# Patient Record
Sex: Female | Born: 1960 | Race: White | Hispanic: No | Smoking: Former smoker
Health system: Southern US, Community
[De-identification: ages and names within clinical notes are randomized; demographics above are authoritative.]

## PROBLEM LIST (undated history)

## (undated) HISTORY — PX: CHOLECYSTECTOMY: SHX55

---

## 2007-11-28 ENCOUNTER — Ambulatory Visit (HOSPITAL_COMMUNITY): Payer: Self-pay | Admitting: Psychiatry

## 2014-07-23 ENCOUNTER — Emergency Department (HOSPITAL_COMMUNITY): Payer: BLUE CROSS/BLUE SHIELD

## 2014-07-23 ENCOUNTER — Observation Stay (HOSPITAL_COMMUNITY)
Admission: EM | Admit: 2014-07-23 | Discharge: 2014-07-25 | Disposition: A | Discharge status: Home / Self Care | Payer: BLUE CROSS/BLUE SHIELD | Attending: Internal Medicine | Admitting: Internal Medicine

## 2014-07-23 ENCOUNTER — Encounter (HOSPITAL_COMMUNITY): Payer: Self-pay | Admitting: *Deleted

## 2014-07-23 DIAGNOSIS — I4581 Long QT syndrome: Principal | ICD-10-CM | POA: Insufficient documentation

## 2014-07-23 DIAGNOSIS — N179 Acute kidney failure, unspecified: Secondary | ICD-10-CM | POA: Insufficient documentation

## 2014-07-23 DIAGNOSIS — E669 Obesity, unspecified: Secondary | ICD-10-CM | POA: Diagnosis not present

## 2014-07-23 DIAGNOSIS — R55 Syncope and collapse: Secondary | ICD-10-CM | POA: Diagnosis present

## 2014-07-23 DIAGNOSIS — Z6834 Body mass index (BMI) 34.0-34.9, adult: Secondary | ICD-10-CM | POA: Diagnosis not present

## 2014-07-23 DIAGNOSIS — Z87891 Personal history of nicotine dependence: Secondary | ICD-10-CM | POA: Insufficient documentation

## 2014-07-23 DIAGNOSIS — R9431 Abnormal electrocardiogram [ECG] [EKG]: Secondary | ICD-10-CM

## 2014-07-23 LAB — COMPREHENSIVE METABOLIC PANEL
ALK PHOS: 148 U/L — AB (ref 38–126)
ALT: 23 U/L (ref 14–54)
AST: 21 U/L (ref 15–41)
Albumin: 3.7 g/dL (ref 3.5–5.0)
Anion gap: 5 (ref 5–15)
BUN: 10 mg/dL (ref 6–20)
CALCIUM: 8.8 mg/dL — AB (ref 8.9–10.3)
CO2: 27 mmol/L (ref 22–32)
Chloride: 108 mmol/L (ref 101–111)
Creatinine, Ser: 1.07 mg/dL — ABNORMAL HIGH (ref 0.44–1.00)
GFR calc Af Amer: >60 mL/min (ref >60)
GFR, EST NON AFRICAN AMERICAN: 58 mL/min — AB (ref >60)
GLUCOSE: 112 mg/dL — AB (ref 65–99)
POTASSIUM: 3.8 mmol/L (ref 3.5–5.1)
Sodium: 140 mmol/L (ref 135–145)
Total Bilirubin: 0.3 mg/dL (ref 0.3–1.2)
Total Protein: 6.8 g/dL (ref 6.5–8.1)

## 2014-07-23 LAB — URINALYSIS, ROUTINE W REFLEX MICROSCOPIC
BILIRUBIN URINE: NEGATIVE
GLUCOSE, UA: NEGATIVE mg/dL
Hgb urine dipstick: NEGATIVE
KETONES UR: NEGATIVE mg/dL
NITRITE: NEGATIVE
Protein, ur: NEGATIVE mg/dL
SPECIFIC GRAVITY, URINE: 1.022 (ref 1.005–1.030)
UROBILINOGEN UA: 1 mg/dL (ref 0.0–1.0)
pH: 6.5 (ref 5.0–8.0)

## 2014-07-23 LAB — CBC WITH DIFFERENTIAL/PLATELET
BASOS PCT: 1 % (ref 0–1)
Basophils Absolute: 0.1 10*3/uL (ref 0.0–0.1)
EOS ABS: 0.1 10*3/uL (ref 0.0–0.7)
EOS PCT: 1 % (ref 0–5)
HCT: 43.3 % (ref 36.0–46.0)
Hemoglobin: 14.5 g/dL (ref 12.0–15.0)
Lymphocytes Relative: 9 % — ABNORMAL LOW (ref 12–46)
Lymphs Abs: 1 10*3/uL (ref 0.7–4.0)
MCH: 27.6 pg (ref 26.0–34.0)
MCHC: 33.5 g/dL (ref 30.0–36.0)
MCV: 82.5 fL (ref 78.0–100.0)
Monocytes Absolute: 0.7 10*3/uL (ref 0.1–1.0)
Monocytes Relative: 6 % (ref 3–12)
NEUTROS ABS: 9.4 10*3/uL — AB (ref 1.7–7.7)
Neutrophils Relative %: 85 % — ABNORMAL HIGH (ref 43–77)
Platelets: 328 10*3/uL (ref 150–400)
RBC: 5.25 MIL/uL — ABNORMAL HIGH (ref 3.87–5.11)
RDW: 13.5 % (ref 11.5–15.5)
WBC: 11.1 10*3/uL — AB (ref 4.0–10.5)

## 2014-07-23 LAB — TROPONIN I: Troponin I: <0.03 ng/mL (ref <0.031)

## 2014-07-23 LAB — PREGNANCY, URINE: PREG TEST UR: NEGATIVE

## 2014-07-23 LAB — URINE MICROSCOPIC-ADD ON

## 2014-07-23 LAB — LIPASE, BLOOD: LIPASE: 17 U/L — AB (ref 22–51)

## 2014-07-23 MED ORDER — SODIUM CHLORIDE 0.9 % IV BOLUS (SEPSIS)
1000.0000 mL | Freq: Once | INTRAVENOUS | Status: AC
  Administered 2014-07-23: 1000 mL via INTRAVENOUS

## 2014-07-23 MED ORDER — ONDANSETRON HCL 4 MG/2ML IJ SOLN
4.0000 mg | Freq: Once | INTRAMUSCULAR | Status: AC
  Administered 2014-07-23: 4 mg via INTRAVENOUS
  Filled 2014-07-23: qty 2

## 2014-07-23 NOTE — ED Notes (Signed)
Pt states that she was at work eating lunch when she began having multiple episodes of diarrhea. Pt pt began feeling "tingly' in her hands and feet. Pt reports being diaphoretic. Pt had nausea and abdominal pain. Pt noted to be hypotensive upon EMS arrival. Pt BP increased during transport to 120 systolic. No IV fluids recieved

## 2014-07-23 NOTE — ED Notes (Signed)
Water provided

## 2014-07-23 NOTE — ED Notes (Signed)
Patient ambulated in the hallway and denied dizziness. Patient placed back on the monitor.

## 2014-07-23 NOTE — ED Notes (Signed)
Unsuccessful with the bedpan

## 2014-07-23 NOTE — H&P (Signed)
Cardiology History and Physical  History of Present Illness (and review of medical records): Sheila Dominguez is a 54 y.o. female who presents for evaluation of near syncope.  Symptoms began while at work today around [employer].  She went to eat lunch and then shortly after felt the need to use the bathroom.  She had 3 different blood movements which she says is normal since after her gall bladder removal.  However, she felt generalize malaise shortly thereafter.  She had to put her head down and stay seated as she felt lightheaded like she would pass out.  She did have a brief episode of abdominal discomfort that went away with BM.  She denied any shortness of breath, chest pain or palpitations.  She denied any syncope.  As she did not have improvement in symptoms, EMS was called.  Reportedly patient has hypotensive on initial vitals per EMS.  She was given Zofran and IVFs in ED.  Cardiology was consulted for near syncope in setting of prolonged QT on EKG.  She denies any prior hx of arrhythmia.  She takes ritalin for ADD.  Previous diagnostic testing for coronary artery disease includes: none. Previous history of cardiac disease includes None. oronary artery disease risk factors include: smoking/ tobacco exposure.  Patient denies history of angina, arrhythmia, cardiomyopathy, coronary artery disease, previous M.I. and valvular disease.  Review of Systems Further review of systems was otherwise negative other than stated in HPI.  Patient Active Problem List   Diagnosis Date Noted  . Prolonged Q-T interval on ECG 07/23/2014   History reviewed. No pertinent past medical history.  Past Surgical History  Procedure Laterality Date  . Cholecystectomy       (Not in a hospital admission) No Known Allergies  History  Substance Use Topics  . Smoking status: Former Research scientist (life sciences)  . Smokeless tobacco: Not on file  . Alcohol Use: No    No family history on file.   Objective:  Patient Vitals for the past 8  hrs:  BP Temp Temp src Pulse Resp SpO2  07/23/14 2215 149/74 mmHg - - 85 22 97 %  07/23/14 2145 134/68 mmHg - - 78 16 96 %  07/23/14 2115 125/87 mmHg - - 81 21 96 %  07/23/14 2100 135/79 mmHg - - 81 13 96 %  07/23/14 2030 143/84 mmHg - - 80 14 97 %  07/23/14 2015 133/81 mmHg - - 81 17 95 %  07/23/14 1956 131/99 mmHg - - 81 16 96 %  07/23/14 1815 128/77 mmHg - - 88 20 98 %  07/23/14 1730 109/68 mmHg - - 86 14 95 %  07/23/14 1715 110/70 mmHg - - 89 15 96 %  07/23/14 1710 128/71 mmHg 98 F (36.7 C) Oral 91 18 98 %  07/23/14 1705 128/71 mmHg 98.6 F (37 C) Oral 84 15 96 %   General appearance: alert, cooperative, appears stated age and no distress Head: Normocephalic, without obvious abnormality, atraumatic Eyes: conjunctivae/corneas clear. PERRL, EOM's intact. Neck: no carotid bruit, no JVD and supple, Lungs: clear to auscultation bilaterally Chest wall: no tenderness Heart: regular rate and rhythm, S1, S2 normal, no murmur, click, rub or gallop Abdomen: soft, non-tender; bowel sounds normal Extremities: extremities normal, atraumatic, no cyanosis or edema Pulses: 2+ and symmetric Neurologic: Grossly normal  Results for orders placed or performed during the hospital encounter of 07/23/14 (from the past 48 hour(s))  CBC with Differential/Platelet     Status: Abnormal   Collection Time: 07/23/14  7:54 PM  Result Value Ref Range   WBC 11.1 (H) 4.0 - 10.5 K/uL   RBC 5.25 (H) 3.87 - 5.11 MIL/uL   Hemoglobin 14.5 12.0 - 15.0 g/dL   HCT 43.3 36.0 - 46.0 %   MCV 82.5 78.0 - 100.0 fL   MCH 27.6 26.0 - 34.0 pg   MCHC 33.5 30.0 - 36.0 g/dL   RDW 13.5 11.5 - 15.5 %   Platelets 328 150 - 400 K/uL   Neutrophils Relative % 85 (H) 43 - 77 %   Neutro Abs 9.4 (H) 1.7 - 7.7 K/uL   Lymphocytes Relative 9 (L) 12 - 46 %   Lymphs Abs 1.0 0.7 - 4.0 K/uL   Monocytes Relative 6 3 - 12 %   Monocytes Absolute 0.7 0.1 - 1.0 K/uL   Eosinophils Relative 1 0 - 5 %   Eosinophils Absolute 0.1 0.0 - 0.7  K/uL   Basophils Relative 1 0 - 1 %   Basophils Absolute 0.1 0.0 - 0.1 K/uL  Comprehensive metabolic panel     Status: Abnormal   Collection Time: 07/23/14  7:54 PM  Result Value Ref Range   Sodium 140 135 - 145 mmol/L   Potassium 3.8 3.5 - 5.1 mmol/L   Chloride 108 101 - 111 mmol/L   CO2 27 22 - 32 mmol/L   Glucose, Bld 112 (H) 65 - 99 mg/dL   BUN 10 6 - 20 mg/dL   Creatinine, Ser 1.07 (H) 0.44 - 1.00 mg/dL   Calcium 8.8 (L) 8.9 - 10.3 mg/dL   Total Protein 6.8 6.5 - 8.1 g/dL   Albumin 3.7 3.5 - 5.0 g/dL   AST 21 15 - 41 U/L   ALT 23 14 - 54 U/L   Alkaline Phosphatase 148 (H) 38 - 126 U/L   Total Bilirubin 0.3 0.3 - 1.2 mg/dL   GFR calc non Af Amer 58 (L) >60 mL/min   GFR calc Af Amer >60 >60 mL/min    Comment: (NOTE) The eGFR has been calculated using the CKD EPI equation. This calculation has not been validated in all clinical situations. eGFR's persistently <60 mL/min signify possible Chronic Kidney Disease.    Anion gap 5 5 - 15  Lipase, blood     Status: Abnormal   Collection Time: 07/23/14  7:54 PM  Result Value Ref Range   Lipase 17 (L) 22 - 51 U/L  Troponin I     Status: None   Collection Time: 07/23/14  7:54 PM  Result Value Ref Range   Troponin I <0.03 <0.031 ng/mL    Comment:        NO INDICATION OF MYOCARDIAL INJURY.   Urinalysis, Routine w reflex microscopic (not at [facility])     Status: Abnormal   Collection Time: 07/23/14  8:09 PM  Result Value Ref Range   Color, Urine YELLOW YELLOW   APPearance CLEAR CLEAR   Specific Gravity, Urine 1.022 1.005 - 1.030   pH 6.5 5.0 - 8.0   Glucose, UA NEGATIVE NEGATIVE mg/dL   Hgb urine dipstick NEGATIVE NEGATIVE   Bilirubin Urine NEGATIVE NEGATIVE   Ketones, ur NEGATIVE NEGATIVE mg/dL   Protein, ur NEGATIVE NEGATIVE mg/dL   Urobilinogen, UA 1.0 0.0 - 1.0 mg/dL   Nitrite NEGATIVE NEGATIVE   Leukocytes, UA SMALL (A) NEGATIVE  Urine microscopic-add on     Status: Abnormal   Collection Time: 07/23/14  8:09 PM  Result  Value Ref Range   Squamous Epithelial / LPF  FEW (A) RARE   WBC, UA 3-6 <3 WBC/hpf   RBC / HPF 0-2 <3 RBC/hpf   Bacteria, UA FEW (A) RARE   Casts HYALINE CASTS (A) NEGATIVE   Urine-Other MUCOUS PRESENT   Pregnancy, urine     Status: None   Collection Time: 07/23/14  8:10 PM  Result Value Ref Range   Preg Test, Ur NEGATIVE NEGATIVE    Comment:        THE SENSITIVITY OF THIS METHODOLOGY IS >20 mIU/mL.    US Aorta  07/23/2014   CLINICAL DATA:  Acute onset of abdominal pain and hypotension. Assess abdominal aorta. Initial encounter.  EXAM: ULTRASOUND OF ABDOMINAL AORTA  TECHNIQUE: Ultrasound examination of the abdominal aorta was performed to evaluate for abdominal aortic aneurysm.  COMPARISON:  None.  FINDINGS: Abdominal Aorta  No aneurysm identified.  Maximum AP  Diameter:  2.0 cm  Maximum TRV  Diameter: 2.1 cm  IMPRESSION: No evidence of abdominal aortic aneurysm. The abdominal aorta is normal in caliber. The aortic bifurcation is not well characterized due to overlying bowel gas.   Electronically Signed   By: Garald Balding M.D.   On: 07/23/2014 19:19    ECG:  5pm  Hr 89 sinus rhythm, diffuse t wave abnormalities, possible LVH. QT 454.  842pm QT now prolonged to 562 similar on repeat at 10pm QT 595. No prior to compare  Assessment: 80F presents with near syncope with prolonged QT interval.  She has no prior EKG to compare.  No reported interaction with Ritalin and Zofran. Unclear if medication related.  Near syncope,possibly vasovagal Prolonged QT AKI ADD on Ritalin  Plan: 1. Cardiology Observation  2. Continuous monitoring on Telemetry for any arrhythmias 3. Repeat ekg in am to monitor QT 4. Trend cardiac biomarkers, check BNP, TSH 5. Gentle IVFs

## 2014-07-23 NOTE — ED Provider Notes (Signed)
CSN: [id number]     Arrival date & time 07/23/14  1704 History   First MD Initiated Contact with Patient 07/23/14 1713     Chief Complaint  Patient presents with  . Abdominal Pain  . Hypotension     (Consider location/radiation/quality/duration/timing/severity/associated sxs/prior Treatment) HPI Comments: Patient from work with episode of near syncope and diarrhea. She states she was eating lunch when she had some pain in her stomach and try to have a bowel movement. She had a normal bowel movement and went back to the table. She went back to the bathroom 2 more times and had some loose stool. She denies any blood in her stool. She went back to the table began to feel lightheaded, diaphoretic, cold chills with nausea. She laid back and did not lose consciousness. Denies any chest pain, shortness of breath or back pain. EMS found her to be hypotensive. Denies fevers, vomiting, chest pain, leg pain or leg swelling. Only medication is Ritalin. Denies any heart or lung problems. Denies any recent travel or anabolic use.  The history is provided by the patient and the EMS personnel.    History reviewed. No pertinent past medical history. Past Surgical History  Procedure Laterality Date  . Cholecystectomy     No family history on file. History  Substance Use Topics  . Smoking status: Former Games developermoker  . Smokeless tobacco: Not on file  . Alcohol Use: No   OB History    No data available     Review of Systems  Constitutional: Positive for activity change, appetite change and fatigue. Negative for fever.  HENT: Negative for congestion and rhinorrhea.   Eyes: Negative for visual disturbance.  Respiratory: Negative for cough, chest tightness and shortness of breath.   Cardiovascular: Negative for chest pain.  Gastrointestinal: Positive for nausea, abdominal pain and diarrhea. Negative for vomiting and blood in stool.  Genitourinary: Negative for dysuria, vaginal bleeding and vaginal  discharge.  Musculoskeletal: Negative for myalgias and arthralgias.  Skin: Negative for wound.  Neurological: Positive for dizziness, weakness and light-headedness.  A complete 10 system review of systems was obtained and all systems are negative except as noted in the HPI and PMH.      Allergies  Review of patient's allergies indicates no known allergies.  Home Medications   Prior to Admission medications   Medication Sig Start Date End Date Taking? Authorizing Provider  methylphenidate (RITALIN) 10 MG tablet Take 40 mg by mouth daily. 07/02/14  Yes Historical Provider, MD   BP 144/68 mmHg  Pulse 84  Temp(Src) 98.4 F (36.9 C) (Oral)  Resp 16  Ht 5\' 7"  (1.702 m)  Wt 220 lb 1.6 oz (99.837 kg)  BMI 34.46 kg/m2  SpO2 99% Physical Exam  Constitutional: She is oriented to person, place, and time. She appears well-developed and well-nourished. No distress.  HENT:  Head: Normocephalic and atraumatic.  Mouth/Throat: Oropharynx is clear and moist. No oropharyngeal exudate.  Eyes: Conjunctivae and EOM are normal. Pupils are equal, round, and reactive to light.  Neck: Normal range of motion. Neck supple.  No meningismus.  Cardiovascular: Normal rate, regular rhythm, normal heart sounds and intact distal pulses.   No murmur heard. Pulmonary/Chest: Effort normal and breath sounds normal. No respiratory distress.  Abdominal: Soft. There is tenderness. There is no rebound and no guarding.  Mild diffuse tenderness  Musculoskeletal: Normal range of motion. She exhibits no edema or tenderness.  Neurological: She is alert and oriented to person, place, and time.  No cranial nerve deficit. She exhibits normal muscle tone. Coordination normal.  No ataxia on finger to nose bilaterally. No pronator drift. 5/5 strength throughout. CN 2-12 intact.Equal grip strength. Sensation intact.   Skin: Skin is warm.  Psychiatric: She has a normal mood and affect. Her behavior is normal.  Nursing note and  vitals reviewed.   ED Course  Procedures (including critical care time) Labs Review Labs Reviewed  URINALYSIS, ROUTINE W REFLEX MICROSCOPIC (NOT AT [facility]) - Abnormal; Notable for the following:    Leukocytes, UA SMALL (*)    All other components within normal limits  CBC WITH DIFFERENTIAL/PLATELET - Abnormal; Notable for the following:    WBC 11.1 (*)    RBC 5.25 (*)    Neutrophils Relative % 85 (*)    Neutro Abs 9.4 (*)    Lymphocytes Relative 9 (*)    All other components within normal limits  COMPREHENSIVE METABOLIC PANEL - Abnormal; Notable for the following:    Glucose, Bld 112 (*)    Creatinine, Ser 1.07 (*)    Calcium 8.8 (*)    Alkaline Phosphatase 148 (*)    GFR calc non Af Amer 58 (*)    All other components within normal limits  LIPASE, BLOOD - Abnormal; Notable for the following:    Lipase 17 (*)    All other components within normal limits  URINE MICROSCOPIC-ADD ON - Abnormal; Notable for the following:    Squamous Epithelial / LPF FEW (*)    Bacteria, UA FEW (*)    Casts HYALINE CASTS (*)    All other components within normal limits  TROPONIN I  PREGNANCY, URINE  PROTIME-INR  CBC  COMPREHENSIVE METABOLIC PANEL  HEMOGLOBIN A1C  TSH  BRAIN NATRIURETIC PEPTIDE  TROPONIN I  TROPONIN I  TROPONIN I    Imaging Review US Aorta  07/23/2014   CLINICAL DATA:  Acute onset of abdominal pain and hypotension. Assess abdominal aorta. Initial encounter.  EXAM: ULTRASOUND OF ABDOMINAL AORTA  TECHNIQUE: Ultrasound examination of the abdominal aorta was performed to evaluate for abdominal aortic aneurysm.  COMPARISON:  None.  FINDINGS: Abdominal Aorta  No aneurysm identified.  Maximum AP  Diameter:  2.0 cm  Maximum TRV  Diameter: 2.1 cm  IMPRESSION: No evidence of abdominal aortic aneurysm. The abdominal aorta is normal in caliber. The aortic bifurcation is not well characterized due to overlying bowel gas.   Electronically Signed   By: Roanna Raider M.D.   On: 07/23/2014  19:19     EKG Interpretation   Date/Time:  Friday July 23 2014 21:29:34 EDT Ventricular Rate:  78 PR Interval:  132 QRS Duration: 73 QT Interval:  522 QTC Calculation: 595 R Axis:   47 Text Interpretation:  Sinus rhythm Nonspecific T abnrm, anterolateral  leads Prolonged QT interval Prolonged QT Confirmed by Manus Gunning  MD, Ellie Spickler  [phone]) on 07/23/2014 9:56:12 PM      MDM   Final diagnoses:  Syncope, unspecified syncope type  Prolonged Q-T interval on ECG   Near-syncope with abdominal pain and diarrhea. Abdomen is soft without peritoneal signs. No chest pain or shortness of breath.  EKG with nonspecific T wave inversions laterally, no comparison.  Prolonged QT. Orthostatics negative.  No AAA on [place].  Concern for syncope in setting of prolonged QT.  Did occur after BM and likely vasovagal but prolonged QT persists on EKG>  D/w cardiology who will admit for observation.   Glynn Octave, MD 07/24/14 [phone]

## 2014-07-23 NOTE — ED Notes (Signed)
MD Rancour at the bedside.   

## 2014-07-23 NOTE — ED Notes (Signed)
Attempted Report x1.   

## 2014-07-23 NOTE — ED Notes (Signed)
Patient returned from US.

## 2014-07-23 NOTE — ED Notes (Signed)
Pt attempting to use bedpan for urine sample at this time.

## 2014-07-24 ENCOUNTER — Observation Stay (HOSPITAL_COMMUNITY): Payer: BLUE CROSS/BLUE SHIELD

## 2014-07-24 DIAGNOSIS — R55 Syncope and collapse: Secondary | ICD-10-CM | POA: Diagnosis not present

## 2014-07-24 DIAGNOSIS — Z87891 Personal history of nicotine dependence: Secondary | ICD-10-CM | POA: Diagnosis not present

## 2014-07-24 DIAGNOSIS — I4581 Long QT syndrome: Secondary | ICD-10-CM | POA: Diagnosis not present

## 2014-07-24 DIAGNOSIS — N179 Acute kidney failure, unspecified: Secondary | ICD-10-CM | POA: Diagnosis not present

## 2014-07-24 LAB — TROPONIN I: Troponin I: <0.03 ng/mL (ref <0.031)

## 2014-07-24 LAB — COMPREHENSIVE METABOLIC PANEL
ALT: 21 U/L (ref 14–54)
ANION GAP: 8 (ref 5–15)
AST: 18 U/L (ref 15–41)
Albumin: 3.1 g/dL — ABNORMAL LOW (ref 3.5–5.0)
Alkaline Phosphatase: 128 U/L — ABNORMAL HIGH (ref 38–126)
BUN: 9 mg/dL (ref 6–20)
CALCIUM: 8.4 mg/dL — AB (ref 8.9–10.3)
CO2: 25 mmol/L (ref 22–32)
CREATININE: 0.9 mg/dL (ref 0.44–1.00)
Chloride: 107 mmol/L (ref 101–111)
GFR calc non Af Amer: >60 mL/min (ref >60)
Glucose, Bld: 101 mg/dL — ABNORMAL HIGH (ref 65–99)
POTASSIUM: 3.6 mmol/L (ref 3.5–5.1)
Sodium: 140 mmol/L (ref 135–145)
Total Bilirubin: 0.7 mg/dL (ref 0.3–1.2)
Total Protein: 6.5 g/dL (ref 6.5–8.1)

## 2014-07-24 LAB — CBC
HCT: 39.1 % (ref 36.0–46.0)
HEMOGLOBIN: 13 g/dL (ref 12.0–15.0)
MCH: 27.2 pg (ref 26.0–34.0)
MCHC: 33.2 g/dL (ref 30.0–36.0)
MCV: 81.8 fL (ref 78.0–100.0)
PLATELETS: 310 10*3/uL (ref 150–400)
RBC: 4.78 MIL/uL (ref 3.87–5.11)
RDW: 13.5 % (ref 11.5–15.5)
WBC: 6.8 10*3/uL (ref 4.0–10.5)

## 2014-07-24 LAB — PROTIME-INR
INR: 1.15 (ref 0.00–1.49)
Prothrombin Time: 14.8 seconds (ref 11.6–15.2)

## 2014-07-24 LAB — TSH: TSH: 2.357 u[IU]/mL (ref 0.350–4.500)

## 2014-07-24 LAB — GLUCOSE, CAPILLARY: Glucose-Capillary: 90 mg/dL (ref 65–99)

## 2014-07-24 LAB — BRAIN NATRIURETIC PEPTIDE: B NATRIURETIC PEPTIDE 5: 25 pg/mL (ref 0.0–100.0)

## 2014-07-24 MED ORDER — SODIUM CHLORIDE 0.9 % IV SOLN
INTRAVENOUS | Status: AC
  Administered 2014-07-24: 01:00:00 via INTRAVENOUS

## 2014-07-24 MED ORDER — POTASSIUM CHLORIDE CRYS ER 20 MEQ PO TBCR
40.0000 meq | EXTENDED_RELEASE_TABLET | Freq: Once | ORAL | Status: AC
  Administered 2014-07-24: 40 meq via ORAL
  Filled 2014-07-24: qty 2

## 2014-07-24 MED ORDER — HEPARIN SODIUM (PORCINE) 5000 UNIT/ML IJ SOLN
5000.0000 [IU] | Freq: Three times a day (TID) | INTRAMUSCULAR | Status: DC
  Administered 2014-07-24 – 2014-07-25 (×4): 5000 [IU] via SUBCUTANEOUS
  Filled 2014-07-24 (×7): qty 1

## 2014-07-24 MED ORDER — SODIUM CHLORIDE 0.9 % IJ SOLN
3.0000 mL | Freq: Two times a day (BID) | INTRAMUSCULAR | Status: DC
  Administered 2014-07-24: 3 mL via INTRAVENOUS

## 2014-07-24 MED ORDER — ACETAMINOPHEN 325 MG PO TABS
650.0000 mg | ORAL_TABLET | Freq: Four times a day (QID) | ORAL | Status: DC | PRN
  Administered 2014-07-24: 650 mg via ORAL
  Filled 2014-07-24: qty 2

## 2014-07-24 MED ORDER — POTASSIUM CHLORIDE 20 MEQ/15ML (10%) PO SOLN
40.0000 meq | Freq: Once | ORAL | Status: DC
  Filled 2014-07-24: qty 30

## 2014-07-24 NOTE — Progress Notes (Signed)
Subjective:  Admitted last night with near syncope but no frank syncope and possible prolonged QT interval.  She has a very abnormal baseline EKG but no prior history of an EKG.  Significant inferolateral T wave inversions.  She states that she hasn't felt well for a couple of weeks but is vague in her description of it is on Ritalin for ADD.  No complaints of chest pain or shortness of breath.  Objective:  Vital Signs in the last 24 hours: BP 126/72 mmHg  Pulse 88  Temp(Src) 98.8 F (37.1 C) (Oral)  Resp 17  Ht 5\' 7"  (1.702 m)  Wt 99.837 kg (220 lb 1.6 oz)  BMI 34.46 kg/m2  SpO2 96%  Physical Exam: Obese white female lying in bed in no acute distress Lungs:  Clear Cardiac:  Regular rhythm, normal S1 and S2, no S3 Abdomen:  Soft, nontender, no masses Extremities:  No edema present  Intake/Output from previous day:    Weight Filed Weights   07/24/14 0017  Weight: 99.837 kg (220 lb 1.6 oz)    Lab Results: Basic Metabolic Panel:  Recent Labs  16/11/9604/03/16 1954 07/24/14 0619  NA 140 140  K 3.8 3.6  CL 108 107  CO2 27 25  GLUCOSE 112* 101*  BUN 10 9  CREATININE 1.07* 0.90   CBC:  Recent Labs  07/23/14 1954 07/24/14 0619  WBC 11.1* 6.8  NEUTROABS 9.4*  --   HGB 14.5 13.0  HCT 43.3 39.1  MCV 82.5 81.8  PLT 328 310   Cardiac Panel (last 3 results)  Recent Labs  07/23/14 1954 07/24/14 0055 07/24/14 0619  TROPONINI <0.03 <0.03 <0.03    Telemetry: Sinus rhythm without arrhythmia overnight  Assessment/Plan:  1.  Dizziness and near syncope that sounds vasovagal by description yesterday 2. Prolonged QT but QT is not that prolonged this morning on EKG 3.  Abnormal baseline EKG  Recommendations:  Check orthostatic blood pressures.  Check echocardiogram.  Stop IV fluids and have her ambulate.  If she does well today we might let her go home in the morning as she has family coming in from out of town.  Probably will need a myocardial perfusion scan because of  the baseline abnormal EKG but this could be done as an outpatient in the absence of other symptoms.     Darden PalmerW. Spencer Tilley, Jr.  MD [facility] Cardiology  07/24/2014, 12:27 PM

## 2014-07-24 NOTE — Progress Notes (Signed)
Echocardiogram 2D Echocardiogram has been performed.  Sheila Dominguez, Sheila Dominguez 07/24/2014, 2:50 PM

## 2014-07-25 ENCOUNTER — Other Ambulatory Visit: Payer: Self-pay | Admitting: Physician Assistant

## 2014-07-25 DIAGNOSIS — R9431 Abnormal electrocardiogram [ECG] [EKG]: Secondary | ICD-10-CM

## 2014-07-25 LAB — BASIC METABOLIC PANEL
Anion gap: 9 (ref 5–15)
BUN: 9 mg/dL (ref 6–20)
CO2: 26 mmol/L (ref 22–32)
Calcium: 8.9 mg/dL (ref 8.9–10.3)
Chloride: 107 mmol/L (ref 101–111)
Creatinine, Ser: 0.83 mg/dL (ref 0.44–1.00)
GFR calc Af Amer: >60 mL/min (ref >60)
GFR calc non Af Amer: >60 mL/min (ref >60)
GLUCOSE: 93 mg/dL (ref 65–99)
Potassium: 3.9 mmol/L (ref 3.5–5.1)
Sodium: 142 mmol/L (ref 135–145)

## 2014-07-25 LAB — GLUCOSE, CAPILLARY: GLUCOSE-CAPILLARY: 85 mg/dL (ref 65–99)

## 2014-07-25 NOTE — Progress Notes (Signed)
Patient ambulated 800 feet tolerated well.

## 2014-07-25 NOTE — Discharge Summary (Signed)
Physician Discharge Summary     Cardiologist:  Hilty Patient ID: Sheila Dominguez MRN: [id number] DOB/AGE: 11-04-1960 54 y.o.  Admit date: 07/23/2014 Discharge date: 07/25/2014  Admission Diagnoses: Near syncope  Discharge Diagnoses:  Active Problems:   Prolonged Q-T interval on ECG   near syncope   Acute kidney injury  Discharged Condition: stable  Hospital Course:   Sheila Dominguez is a 54 y.o. female who presents for evaluation of near syncope. Symptoms began while at work today around [employer]. She went to eat lunch and then shortly after felt the need to use the bathroom. She had 3 different blood movements which she says is normal since after her gall bladder removal. However, she felt generalize malaise shortly thereafter. She had to put her head down and stay seated as she felt lightheaded like she would pass out. She did have a brief episode of abdominal discomfort that went away with BM. She denied any shortness of breath, chest pain or palpitations. She denied any syncope. As she did not have improvement in symptoms, EMS was called. Reportedly patient has hypotensive on initial vitals per EMS. She was given Zofran and IVFs in ED. Cardiology was consulted for near syncope in setting of prolonged QT on EKG. She denies any prior hx of arrhythmia. She takes ritalin for ADD.  Previous diagnostic testing for coronary artery disease includes: none. Previous history of cardiac disease includes None. Coronary artery disease risk factors include: smoking/ tobacco exposure.  Patient denies history of angina, arrhythmia, cardiomyopathy, coronary artery disease, previous M.I. and valvular disease.  Patient was admitted for observation.  She had an echocardiogram which revealed normal ejection fraction and wall motion.  He ruled out for myocardial infarction. Her BNP was 25.  Serum creatinine normalized. Her EKG shows significant inferior lateral T-wave inversion.  He had no  arrhythmias while on telemetry.  She was not orthostatic.   Symptoms were thought to be vasovagal.  She'll be scheduled for outpatient treadmill Cardiolite.  The patient was seen by Dr. Donnie Aho who felt she was stable for DC home.   Consults: None  Significant Diagnostic Studies:  Echocardiogram Study Conclusions  - Left ventricle: The cavity size was normal. Systolic function was normal. The estimated ejection fraction was in the range of 60% to 65%. Wall motion was normal; there were no regional wall motion abnormalities.  Treatments: See above  Discharge Exam: Blood pressure 145/84, pulse 66, temperature 98.2 F (36.8 C), temperature source Oral, resp. rate 16, height  (1.702 m), weight 220 lb 1.6 oz (99.837 kg), SpO2 98 %.   Disposition: Final discharge disposition not confirmed      Discharge Instructions    Diet - low sodium heart healthy    Complete by:  As directed             Medication List    TAKE these medications        methylphenidate 10 MG tablet  Commonly known as:  RITALIN  Take 40 mg by mouth daily.       Follow-up Information    Follow up with Chrystie Nose, MD.   Specialty:  Cardiology   Why:  The office will call you with a follow-up appointment date and time and the date for your stress test.   Contact information:   [address] [place] [address] [phone]      Greater than 30 minutes was spent completing the patient's discharge.    Signed: HAGER,  BRYAN, PA-C 07/25/2014, 11:54 AM  Seen earlier and agree with discharge note.   Darden PalmerW. Spencer Tilley, Jr. MD [facility] Care[facility] 07/25/2014 11:57 AM

## 2014-07-25 NOTE — Progress Notes (Signed)
Subjective:  Ambulatory in hall without recurrent dizziness or syncope.  Repeat EKG shows significant inferolateral T wave changes unchanged from before.  No chest pain or shortness of breath.  Echo showed normal LV function yesterday.  Objective:  Vital Signs in the last 24 hours: BP 145/84 mmHg  Pulse 66  Temp(Src) 98.2 F (36.8 C) (Oral)  Resp 16  Ht 5\' 7"  (1.702 m)  Wt 99.837 kg (220 lb 1.6 oz)  BMI 34.46 kg/m2  SpO2 98%  Physical Exam: Obese white female lying in bed in no acute distress Lungs:  Clear Cardiac:  Regular rhythm, normal S1 and S2, no S3 Abdomen:  Soft, nontender, no masses Extremities:  No edema present  Intake/Output from previous day:    Weight Filed Weights   07/24/14 0017  Weight: 99.837 kg (220 lb 1.6 oz)    Lab Results: Basic Metabolic Panel:  Recent Labs  08/65/7804/06/04 0619 07/25/14 0521  NA 140 142  K 3.6 3.9  CL 107 107  CO2 25 26  GLUCOSE 101* 93  BUN 9 9  CREATININE 0.90 0.83   CBC:  Recent Labs  07/23/14 1954 07/24/14 0619  WBC 11.1* 6.8  NEUTROABS 9.4*  --   HGB 14.5 13.0  HCT 43.3 39.1  MCV 82.5 81.8  PLT 328 310   Cardiac Panel (last 3 results)  Recent Labs  07/24/14 0055 07/24/14 0619 07/24/14 1152  TROPONINI <0.03 <0.03 <0.03    Telemetry: Sinus rhythm without arrhythmia overnight  Assessment/Plan:  1.  Dizziness and near syncope that sounds vasovagal by description yesterday  2. Prolonged QT on admission was 0.44 yesterday.   3.  Abnormal baseline EKG  Recommendations:  Okay for discharge today.  No arrhythmias overnight.  She should have an outpatient myocardial perfusion scan with exercise.  Follow-up with Dr. Rennis GoldenHilty as outpatient.       Darden PalmerW. Spencer Nyjah Schwake, Jr.  MD [facility] Cardiology  07/25/2014, 10:43 AM

## 2014-07-25 NOTE — Progress Notes (Signed)
07/25/2014 12:32 PM Nursing note D/c avs form, medications already taken today and those due this evening given and explained to patient. Follow up appointments and when to call MD reviewed. RX reviewed. D/c iv. D/c tele. D/c home per orders.  Crickett Abbett, Blanchard KelchStephanie Ingold

## 2014-07-26 ENCOUNTER — Telehealth: Payer: Self-pay | Admitting: Internal Medicine

## 2014-07-26 ENCOUNTER — Other Ambulatory Visit: Payer: Self-pay | Admitting: Family Medicine

## 2014-07-26 ENCOUNTER — Ambulatory Visit (INDEPENDENT_AMBULATORY_CARE_PROVIDER_SITE_OTHER): Payer: BLUE CROSS/BLUE SHIELD

## 2014-07-26 DIAGNOSIS — R05 Cough: Secondary | ICD-10-CM | POA: Diagnosis not present

## 2014-07-26 DIAGNOSIS — R0602 Shortness of breath: Secondary | ICD-10-CM

## 2014-07-26 DIAGNOSIS — R059 Cough, unspecified: Secondary | ICD-10-CM

## 2014-07-26 LAB — HEMOGLOBIN A1C
Hgb A1c MFr Bld: 5.6 % (ref 4.8–5.6)
Mean Plasma Glucose: 114 mg/dL

## 2014-07-26 NOTE — Telephone Encounter (Signed)
Pending appt/order for stress test, pt has been instructed we will contact her for this.

## 2014-07-26 NOTE — Telephone Encounter (Signed)
This patient needs a hospital discharge phone call. 

## 2014-07-27 ENCOUNTER — Encounter (HOSPITAL_BASED_OUTPATIENT_CLINIC_OR_DEPARTMENT_OTHER): Payer: Self-pay

## 2014-07-27 ENCOUNTER — Ambulatory Visit (HOSPITAL_BASED_OUTPATIENT_CLINIC_OR_DEPARTMENT_OTHER)
Admission: RE | Admit: 2014-07-27 | Discharge: 2014-07-27 | Disposition: A | Discharge status: Home / Self Care | Payer: BLUE CROSS/BLUE SHIELD | Source: Ambulatory Visit | Attending: Family Medicine | Admitting: Family Medicine

## 2014-07-27 ENCOUNTER — Other Ambulatory Visit (HOSPITAL_BASED_OUTPATIENT_CLINIC_OR_DEPARTMENT_OTHER): Payer: Self-pay | Admitting: Family Medicine

## 2014-07-27 DIAGNOSIS — R059 Cough, unspecified: Secondary | ICD-10-CM

## 2014-07-27 DIAGNOSIS — R0602 Shortness of breath: Secondary | ICD-10-CM

## 2014-07-27 DIAGNOSIS — Z87891 Personal history of nicotine dependence: Secondary | ICD-10-CM | POA: Insufficient documentation

## 2014-07-27 DIAGNOSIS — R05 Cough: Secondary | ICD-10-CM | POA: Diagnosis not present

## 2014-07-27 DIAGNOSIS — R55 Syncope and collapse: Secondary | ICD-10-CM | POA: Insufficient documentation

## 2014-07-27 MED ORDER — IOHEXOL 350 MG/ML SOLN
100.0000 mL | Freq: Once | INTRAVENOUS | Status: AC | PRN
  Administered 2014-07-27: 100 mL via INTRAVENOUS

## 2014-08-11 ENCOUNTER — Telehealth (HOSPITAL_COMMUNITY): Payer: Self-pay

## 2014-08-11 NOTE — Telephone Encounter (Signed)
Left message on voicemail in reference to upcoming appointment scheduled for 08-13-2014. Phone number given for a call back so details instructions can be given. Sheila Dominguez A   

## 2014-08-13 ENCOUNTER — Ambulatory Visit (HOSPITAL_COMMUNITY): Payer: BLUE CROSS/BLUE SHIELD | Attending: Cardiovascular Disease

## 2014-08-13 DIAGNOSIS — R002 Palpitations: Secondary | ICD-10-CM | POA: Insufficient documentation

## 2014-08-13 DIAGNOSIS — Z87891 Personal history of nicotine dependence: Secondary | ICD-10-CM | POA: Diagnosis not present

## 2014-08-13 DIAGNOSIS — R0609 Other forms of dyspnea: Secondary | ICD-10-CM | POA: Insufficient documentation

## 2014-08-13 DIAGNOSIS — R9439 Abnormal result of other cardiovascular function study: Secondary | ICD-10-CM | POA: Insufficient documentation

## 2014-08-13 DIAGNOSIS — R9431 Abnormal electrocardiogram [ECG] [EKG]: Secondary | ICD-10-CM | POA: Diagnosis not present

## 2014-08-13 DIAGNOSIS — R5383 Other fatigue: Secondary | ICD-10-CM | POA: Diagnosis not present

## 2014-08-13 DIAGNOSIS — R0789 Other chest pain: Secondary | ICD-10-CM | POA: Diagnosis not present

## 2014-08-13 LAB — MYOCARDIAL PERFUSION IMAGING
CHL CUP NUCLEAR SRS: 3
CHL CUP RESTING HR STRESS: 88 {beats}/min
CSEPED: 9 min
Estimated workload: 9.9 METS
Exercise duration (sec): 0 s
LV dias vol: 54 mL
LVSYSVOL: 16 mL
MPHR: 167 {beats}/min
NUC STRESS TID: 0.89
Peak HR: 150 {beats}/min
Percent HR: 89 %
RATE: 0.35
SDS: 6
SSS: 9

## 2014-08-13 MED ORDER — TECHNETIUM TC 99M SESTAMIBI GENERIC - CARDIOLITE
33.0000 | Freq: Once | INTRAVENOUS | Status: AC | PRN
  Administered 2014-08-13: 33 via INTRAVENOUS

## 2014-08-13 MED ORDER — TECHNETIUM TC 99M SESTAMIBI GENERIC - CARDIOLITE
11.0000 | Freq: Once | INTRAVENOUS | Status: AC | PRN
  Administered 2014-08-13: 11 via INTRAVENOUS

## 2014-08-25 ENCOUNTER — Ambulatory Visit: Payer: BLUE CROSS/BLUE SHIELD | Admitting: Cardiology

## 2015-05-19 ENCOUNTER — Other Ambulatory Visit: Payer: Self-pay | Admitting: Family Medicine

## 2015-05-19 DIAGNOSIS — Z1231 Encounter for screening mammogram for malignant neoplasm of breast: Secondary | ICD-10-CM

## 2015-05-19 DIAGNOSIS — Z780 Asymptomatic menopausal state: Secondary | ICD-10-CM

## 2016-03-08 IMAGING — CT CT ANGIO CHEST
2 of 6 series · 19 of 36 positions shown · IV contrast (APPLIED)
Comparison: None.

CLINICAL DATA: Shortness of breath, cough, recent syncopal episode,
former smoking history

EXAM:
CT ANGIOGRAPHY CHEST WITH CONTRAST
TECHNIQUE: Multidetector CT imaging of the chest was performed using the
standard protocol during bolus administration of intravenous
contrast. Multiplanar CT image reconstructions and MIPs were
obtained to evaluate the vascular anatomy.
CONTRAST:  100mL OMNIPAQUE IOHEXOL 350 MG/ML SOLN

[Series 5: pe 1.0 b26f · axial · 0.64mm/px · z∈[-330,-62]mm · 18 of 298 slices shown]
[im 15/298  lung]
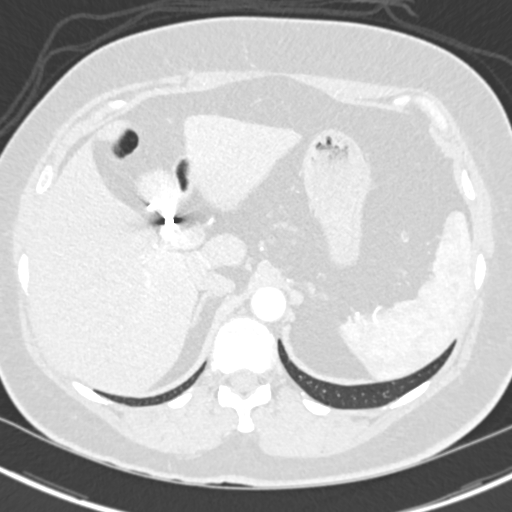
[im 30/298  mediastinal]
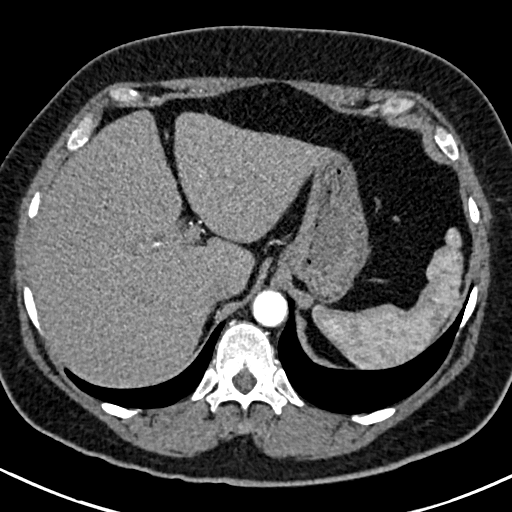
[im 45/298  lung]
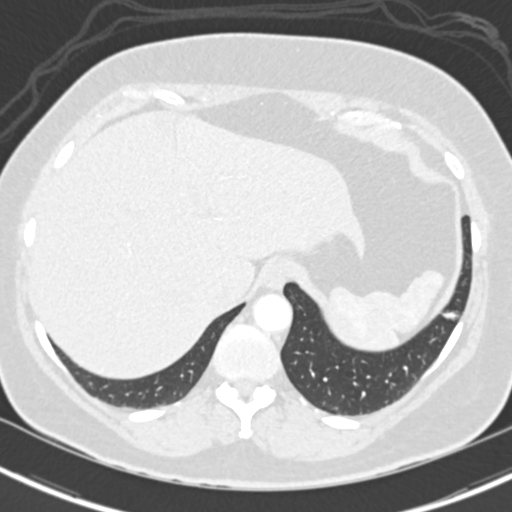
[im 60/298  mediastinal]
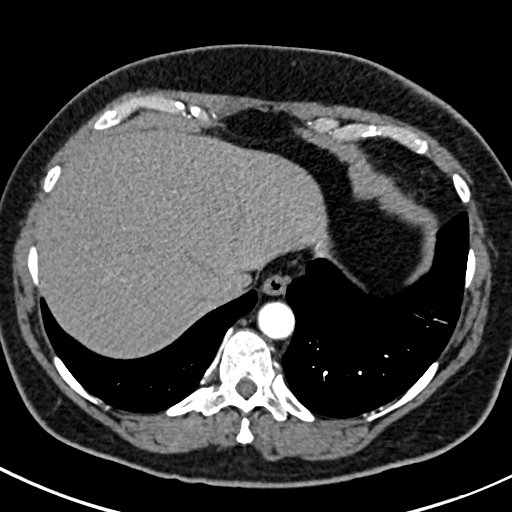
[im 75/298  lung]
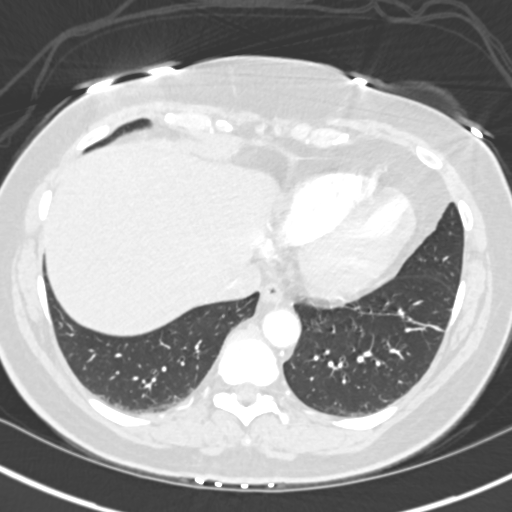
[im 90/298  mediastinal]
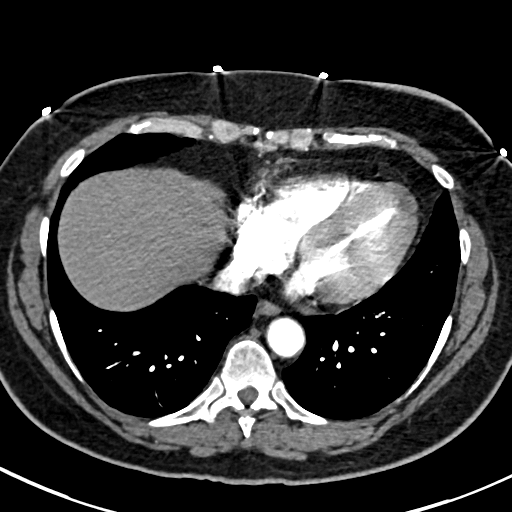
[im 104/298  lung]
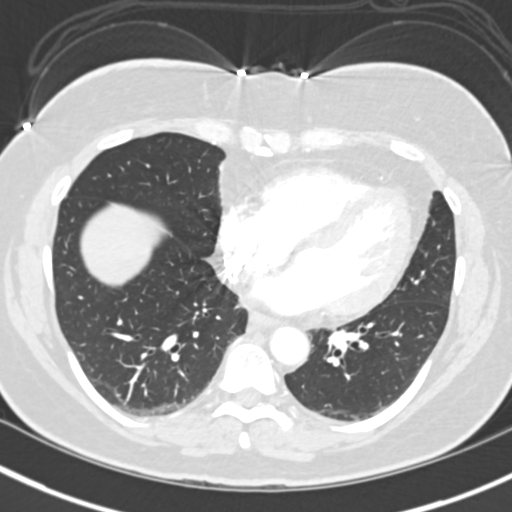
[im 119/298  mediastinal]
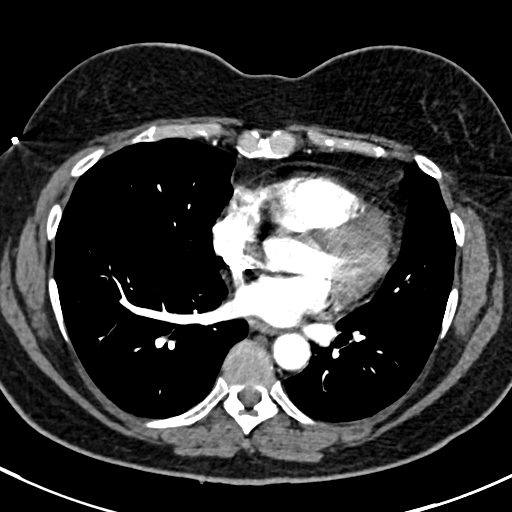
[im 134/298  lung]
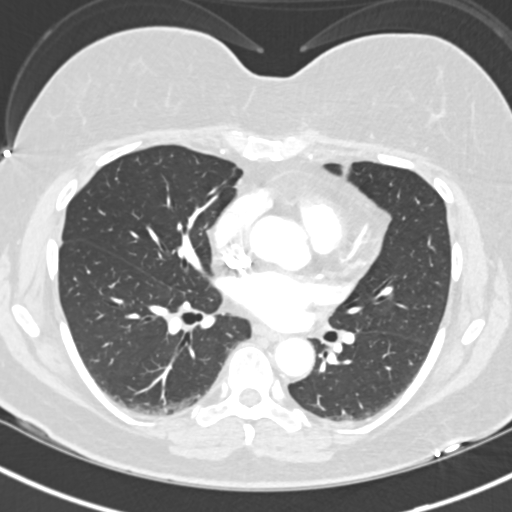
[im 164/298  mediastinal]
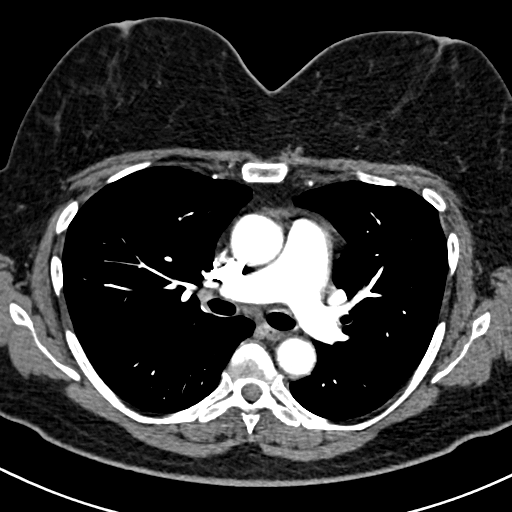
[im 179/298  lung]
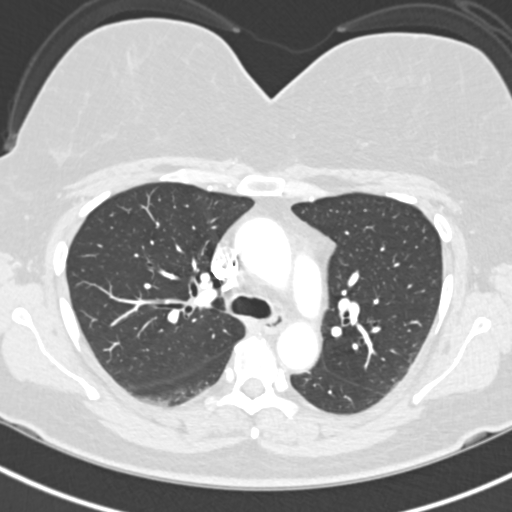
[im 194/298  mediastinal]
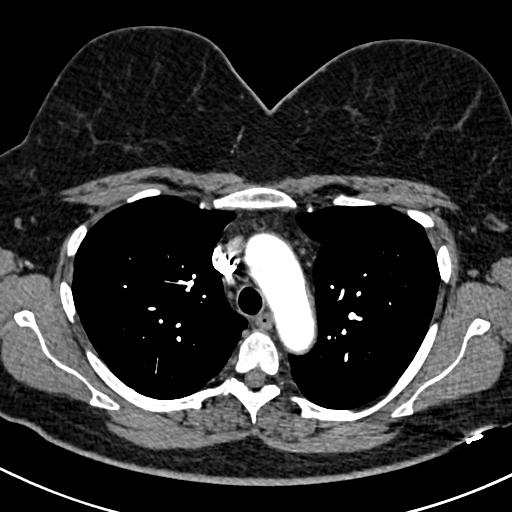
[im 208/298  lung]
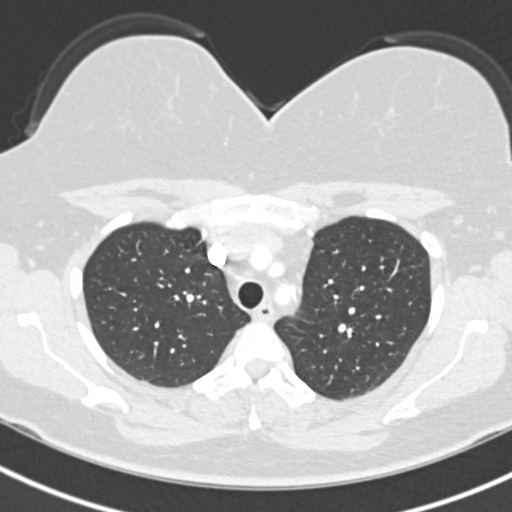
[im 223/298  mediastinal]
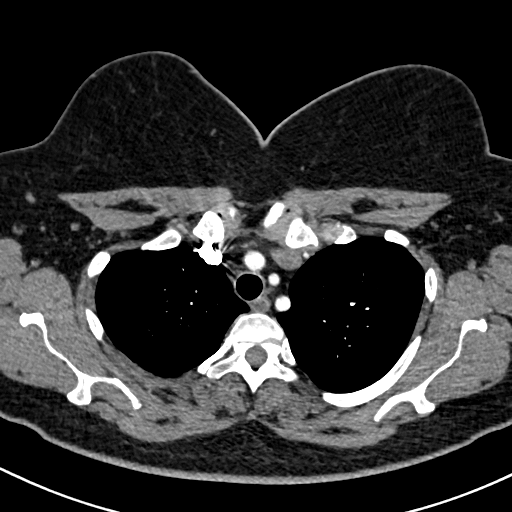
[im 238/298  lung]
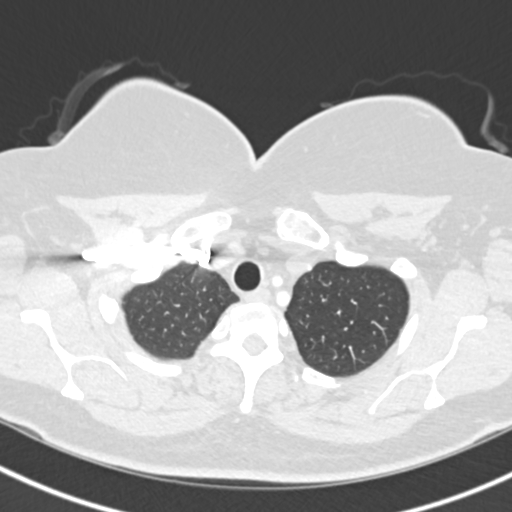
[im 253/298  mediastinal]
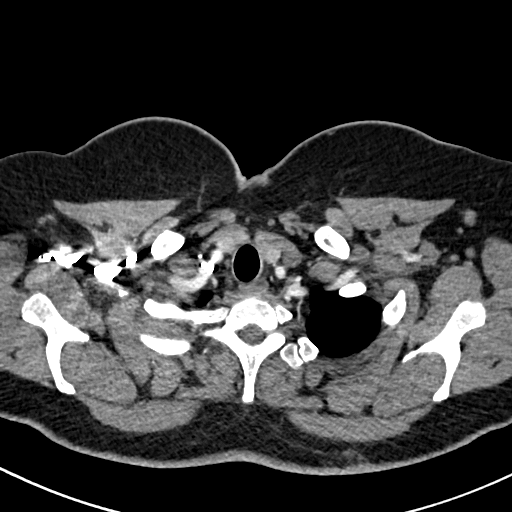
[im 268/298  lung]
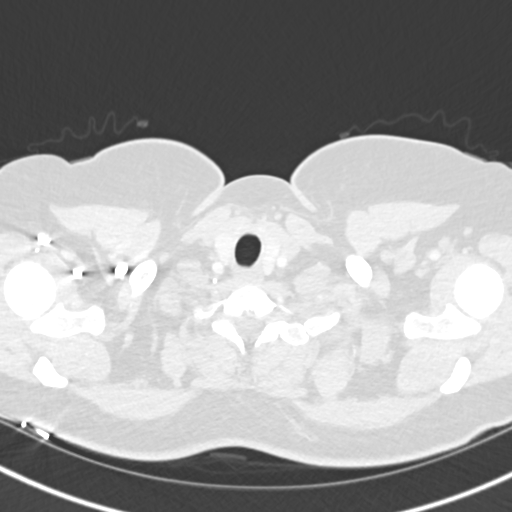
[im 283/298  mediastinal]
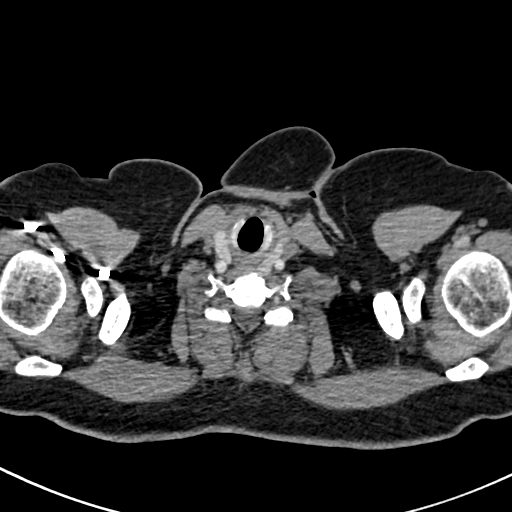

[Series 8: pe 2.0 coronal · coronal · 0.59mm/px · 1 of 122 slices shown]
[im 61/122  mediastinal]
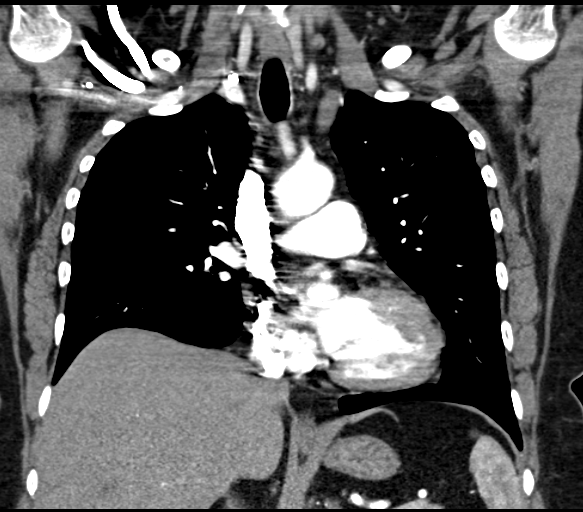

[19 of 36 positions shown; findings below may reference images not displayed]

FINDINGS: The pulmonary arteries opacify well, and there is no evidence of
acute pulmonary embolism. The thoracic aorta also opacifies with no
acute abnormality noted. No mediastinal or hilar adenopathy is seen.
The portion of the upper abdomen that is visualized is unremarkable.

On lung window images, linear scarring is noted in the left lower
lobe anterolaterally. However, no lung infiltrate is seen. There is
no evidence of pleural effusion. No suspicious lung nodule is seen.
The central airway is patent. On bone window images no bony
abnormality is seen.

Review of the MIP images confirms the above findings.
IMPRESSION: Negative CT of the chest.  No evidence of acute pulmonary embolism.
